# Patient Record
Sex: Male | Born: 1995 | Race: White | Hispanic: No | Marital: Single | Smoking: Current every day smoker
Health system: Southern US, Community
[De-identification: ages and names within clinical notes are randomized; demographics above are authoritative.]

---

## 2020-12-28 ENCOUNTER — Inpatient Hospital Stay (HOSPITAL_COMMUNITY): Payer: Commercial Managed Care - PPO

## 2020-12-28 ENCOUNTER — Emergency Department (HOSPITAL_COMMUNITY): Payer: Commercial Managed Care - PPO

## 2020-12-28 ENCOUNTER — Inpatient Hospital Stay (HOSPITAL_COMMUNITY)
Admission: EM | Admit: 2020-12-28 | Discharge: 2021-01-03 | DRG: 329 | Disposition: A | Discharge status: Home / Self Care | Payer: Commercial Managed Care - PPO | Attending: General Surgery | Admitting: General Surgery

## 2020-12-28 ENCOUNTER — Emergency Department (HOSPITAL_COMMUNITY): Payer: Commercial Managed Care - PPO | Admitting: Registered Nurse

## 2020-12-28 ENCOUNTER — Encounter (HOSPITAL_COMMUNITY): Admission: EM | Disposition: A | Discharge status: Home / Self Care | Payer: Self-pay | Source: Home / Self Care

## 2020-12-28 DIAGNOSIS — J95821 Acute postprocedural respiratory failure: Secondary | ICD-10-CM | POA: Diagnosis not present

## 2020-12-28 DIAGNOSIS — D62 Acute posthemorrhagic anemia: Secondary | ICD-10-CM | POA: Diagnosis present

## 2020-12-28 DIAGNOSIS — S91311A Laceration without foreign body, right foot, initial encounter: Secondary | ICD-10-CM | POA: Diagnosis present

## 2020-12-28 DIAGNOSIS — Y92410 Unspecified street and highway as the place of occurrence of the external cause: Secondary | ICD-10-CM

## 2020-12-28 DIAGNOSIS — E872 Acidosis: Secondary | ICD-10-CM | POA: Diagnosis present

## 2020-12-28 DIAGNOSIS — Z9911 Dependence on respirator [ventilator] status: Secondary | ICD-10-CM

## 2020-12-28 DIAGNOSIS — Z20822 Contact with and (suspected) exposure to covid-19: Secondary | ICD-10-CM | POA: Diagnosis present

## 2020-12-28 DIAGNOSIS — S01511A Laceration without foreign body of lip, initial encounter: Secondary | ICD-10-CM | POA: Diagnosis present

## 2020-12-28 DIAGNOSIS — E876 Hypokalemia: Secondary | ICD-10-CM | POA: Diagnosis present

## 2020-12-28 DIAGNOSIS — R571 Hypovolemic shock: Secondary | ICD-10-CM | POA: Diagnosis present

## 2020-12-28 DIAGNOSIS — Z9049 Acquired absence of other specified parts of digestive tract: Secondary | ICD-10-CM

## 2020-12-28 DIAGNOSIS — R4781 Slurred speech: Secondary | ICD-10-CM | POA: Diagnosis present

## 2020-12-28 DIAGNOSIS — Z880 Allergy status to penicillin: Secondary | ICD-10-CM

## 2020-12-28 DIAGNOSIS — K567 Ileus, unspecified: Secondary | ICD-10-CM

## 2020-12-28 DIAGNOSIS — S92351A Displaced fracture of fifth metatarsal bone, right foot, initial encounter for closed fracture: Secondary | ICD-10-CM | POA: Diagnosis present

## 2020-12-28 DIAGNOSIS — S39091A Other injury of muscle, fascia and tendon of abdomen, initial encounter: Secondary | ICD-10-CM | POA: Diagnosis present

## 2020-12-28 DIAGNOSIS — Z23 Encounter for immunization: Secondary | ICD-10-CM

## 2020-12-28 DIAGNOSIS — T1490XA Injury, unspecified, initial encounter: Secondary | ICD-10-CM

## 2020-12-28 DIAGNOSIS — M549 Dorsalgia, unspecified: Secondary | ICD-10-CM | POA: Diagnosis present

## 2020-12-28 DIAGNOSIS — S36498A Other injury of other part of small intestine, initial encounter: Secondary | ICD-10-CM | POA: Diagnosis present

## 2020-12-28 DIAGNOSIS — K9189 Other postprocedural complications and disorders of digestive system: Secondary | ICD-10-CM

## 2020-12-28 DIAGNOSIS — T148XXA Other injury of unspecified body region, initial encounter: Secondary | ICD-10-CM

## 2020-12-28 DIAGNOSIS — S36899A Unspecified injury of other intra-abdominal organs, initial encounter: Secondary | ICD-10-CM

## 2020-12-28 HISTORY — PX: ABDOMINAL WALL DEFECT REPAIR: SHX53

## 2020-12-28 HISTORY — PX: COMPLEX WOUND CLOSURE: SHX6446

## 2020-12-28 HISTORY — PX: LAPAROTOMY: SHX154

## 2020-12-28 HISTORY — PX: BOWEL RESECTION: SHX1257

## 2020-12-28 HISTORY — PX: APPLICATION OF WOUND VAC: SHX5189

## 2020-12-28 LAB — POCT I-STAT 7, (LYTES, BLD GAS, ICA,H+H)
Acid-base deficit: 14 mmol/L — ABNORMAL HIGH (ref 0.0–2.0)
Acid-base deficit: 10 mmol/L — ABNORMAL HIGH (ref 0.0–2.0)
Acid-base deficit: 11 mmol/L — ABNORMAL HIGH (ref 0.0–2.0)
Acid-base deficit: 13 mmol/L — ABNORMAL HIGH (ref 0.0–2.0)
Acid-base deficit: 5 mmol/L — ABNORMAL HIGH (ref 0.0–2.0)
Bicarbonate: 15.6 mmol/L — ABNORMAL LOW (ref 20.0–28.0)
Bicarbonate: 14.9 mmol/L — ABNORMAL LOW (ref 20.0–28.0)
Bicarbonate: 16.4 mmol/L — ABNORMAL LOW (ref 20.0–28.0)
Bicarbonate: 16.6 mmol/L — ABNORMAL LOW (ref 20.0–28.0)
Bicarbonate: 20.2 mmol/L (ref 20.0–28.0)
Calcium, Ion: 0.99 mmol/L — ABNORMAL LOW (ref 1.15–1.40)
Calcium, Ion: 0.69 mmol/L — CL (ref 1.15–1.40)
Calcium, Ion: 0.93 mmol/L — ABNORMAL LOW (ref 1.15–1.40)
Calcium, Ion: 1.08 mmol/L — ABNORMAL LOW (ref 1.15–1.40)
Calcium, Ion: 1.17 mmol/L (ref 1.15–1.40)
HCT: 30 % — ABNORMAL LOW (ref 39.0–52.0)
HCT: 25 % — ABNORMAL LOW (ref 39.0–52.0)
HCT: 26 % — ABNORMAL LOW (ref 39.0–52.0)
HCT: 27 % — ABNORMAL LOW (ref 39.0–52.0)
HCT: 33 % — ABNORMAL LOW (ref 39.0–52.0)
Hemoglobin: 10.2 g/dL — ABNORMAL LOW (ref 13.0–17.0)
Hemoglobin: 8.5 g/dL — ABNORMAL LOW (ref 13.0–17.0)
Hemoglobin: 8.8 g/dL — ABNORMAL LOW (ref 13.0–17.0)
Hemoglobin: 9.2 g/dL — ABNORMAL LOW (ref 13.0–17.0)
Hemoglobin: 11.2 g/dL — ABNORMAL LOW (ref 13.0–17.0)
O2 Saturation: 100 %
O2 Saturation: 100 %
O2 Saturation: 100 %
O2 Saturation: 100 %
O2 Saturation: 92 %
Patient temperature: 35.9
Patient temperature: 35.4
Patient temperature: 35.5
Patient temperature: 35.6
Patient temperature: 98.5
Potassium: 3.1 mmol/L — ABNORMAL LOW (ref 3.5–5.1)
Potassium: 4 mmol/L (ref 3.5–5.1)
Potassium: 4.1 mmol/L (ref 3.5–5.1)
Potassium: 4.1 mmol/L (ref 3.5–5.1)
Potassium: 3.6 mmol/L (ref 3.5–5.1)
Sodium: 147 mmol/L — ABNORMAL HIGH (ref 135–145)
Sodium: 146 mmol/L — ABNORMAL HIGH (ref 135–145)
Sodium: 146 mmol/L — ABNORMAL HIGH (ref 135–145)
Sodium: 146 mmol/L — ABNORMAL HIGH (ref 135–145)
Sodium: 147 mmol/L — ABNORMAL HIGH (ref 135–145)
TCO2: 17 mmol/L — ABNORMAL LOW (ref 22–32)
TCO2: 16 mmol/L — ABNORMAL LOW (ref 22–32)
TCO2: 18 mmol/L — ABNORMAL LOW (ref 22–32)
TCO2: 18 mmol/L — ABNORMAL LOW (ref 22–32)
TCO2: 21 mmol/L — ABNORMAL LOW (ref 22–32)
pCO2 arterial: 48.1 mmHg — ABNORMAL HIGH (ref 32.0–48.0)
pCO2 arterial: 34.6 mmHg (ref 32.0–48.0)
pCO2 arterial: 39.7 mmHg (ref 32.0–48.0)
pCO2 arterial: 40.4 mmHg (ref 32.0–48.0)
pCO2 arterial: 38.3 mmHg (ref 32.0–48.0)
pH, Arterial: 7.112 — CL (ref 7.350–7.450)
pH, Arterial: 7.167 — CL (ref 7.350–7.450)
pH, Arterial: 7.216 — ABNORMAL LOW (ref 7.350–7.450)
pH, Arterial: 7.282 — ABNORMAL LOW (ref 7.350–7.450)
pH, Arterial: 7.329 — ABNORMAL LOW (ref 7.350–7.450)
pO2, Arterial: 586 mmHg — ABNORMAL HIGH (ref 83.0–108.0)
pO2, Arterial: 242 mmHg — ABNORMAL HIGH (ref 83.0–108.0)
pO2, Arterial: 287 mmHg — ABNORMAL HIGH (ref 83.0–108.0)
pO2, Arterial: 390 mmHg — ABNORMAL HIGH (ref 83.0–108.0)
pO2, Arterial: 67 mmHg — ABNORMAL LOW (ref 83.0–108.0)

## 2020-12-28 LAB — I-STAT CHEM 8, ED
BUN: 12 mg/dL (ref 6–20)
Calcium, Ion: 0.97 mmol/L — ABNORMAL LOW (ref 1.15–1.40)
Chloride: 111 mmol/L (ref 98–111)
Creatinine, Ser: 1.6 mg/dL — ABNORMAL HIGH (ref 0.61–1.24)
Glucose, Bld: 142 mg/dL — ABNORMAL HIGH (ref 70–99)
HCT: 46 % (ref 39.0–52.0)
Hemoglobin: 15.6 g/dL (ref 13.0–17.0)
Potassium: 3.6 mmol/L (ref 3.5–5.1)
Sodium: 144 mmol/L (ref 135–145)
TCO2: 17 mmol/L — ABNORMAL LOW (ref 22–32)

## 2020-12-28 LAB — BASIC METABOLIC PANEL
Anion gap: 13 (ref 5–15)
BUN: 8 mg/dL (ref 6–20)
CO2: 21 mmol/L — ABNORMAL LOW (ref 22–32)
Calcium: 8.6 mg/dL — ABNORMAL LOW (ref 8.9–10.3)
Chloride: 110 mmol/L (ref 98–111)
Creatinine, Ser: 0.89 mg/dL (ref 0.61–1.24)
GFR, Estimated: >60 mL/min (ref >60)
Glucose, Bld: 109 mg/dL — ABNORMAL HIGH (ref 70–99)
Potassium: 3.5 mmol/L (ref 3.5–5.1)
Sodium: 144 mmol/L (ref 135–145)

## 2020-12-28 LAB — HIV ANTIBODY (ROUTINE TESTING W REFLEX): HIV Screen 4th Generation wRfx: NONREACTIVE

## 2020-12-28 LAB — RESP PANEL BY RT-PCR (FLU A&B, COVID) ARPGX2
Influenza A by PCR: NEGATIVE
Influenza B by PCR: NEGATIVE
SARS Coronavirus 2 by RT PCR: NEGATIVE

## 2020-12-28 LAB — GLUCOSE, CAPILLARY
Glucose-Capillary: 106 mg/dL — ABNORMAL HIGH (ref 70–99)
Glucose-Capillary: 94 mg/dL (ref 70–99)

## 2020-12-28 LAB — ABO/RH: ABO/RH(D): O POS

## 2020-12-28 LAB — CBC
HCT: 33.2 % — ABNORMAL LOW (ref 39.0–52.0)
Hemoglobin: 11.7 g/dL — ABNORMAL LOW (ref 13.0–17.0)
MCH: 30.1 pg (ref 26.0–34.0)
MCHC: 35.2 g/dL (ref 30.0–36.0)
MCV: 85.3 fL (ref 80.0–100.0)
Platelets: 105 10*3/uL — ABNORMAL LOW (ref 150–400)
RBC: 3.89 MIL/uL — ABNORMAL LOW (ref 4.22–5.81)
RDW: 13.9 % (ref 11.5–15.5)
WBC: 8.8 10*3/uL (ref 4.0–10.5)
nRBC: 0 % (ref 0.0–0.2)

## 2020-12-28 LAB — BLOOD PRODUCT ORDER (VERBAL) VERIFICATION

## 2020-12-28 LAB — ETHANOL: Alcohol, Ethyl (B): 50 mg/dL — ABNORMAL HIGH (ref <10)

## 2020-12-28 LAB — MRSA NEXT GEN BY PCR, NASAL: MRSA by PCR Next Gen: NOT DETECTED

## 2020-12-28 SURGERY — LAPAROTOMY, EXPLORATORY
Anesthesia: General | Site: Ankle | Laterality: Right

## 2020-12-28 MED ORDER — CALCIUM CHLORIDE 10 % IV SOLN
INTRAVENOUS | Status: DC | PRN
  Administered 2020-12-28 (×3): 500 mg via INTRAVENOUS

## 2020-12-28 MED ORDER — NOREPINEPHRINE 4 MG/250ML-% IV SOLN
0.0000 ug/min | INTRAVENOUS | Status: DC
  Administered 2020-12-29: 5 ug/min via INTRAVENOUS
  Filled 2020-12-28: qty 250

## 2020-12-28 MED ORDER — PANTOPRAZOLE SODIUM 40 MG PO PACK: 40.0000 mg | PACK | Freq: Every day | ORAL | Status: DC

## 2020-12-28 MED ORDER — CALCIUM CHLORIDE 10 % IV SOLN
INTRAVENOUS | Status: AC
  Filled 2020-12-28: qty 20

## 2020-12-28 MED ORDER — PANTOPRAZOLE SODIUM 40 MG PO TBEC: 40.0000 mg | DELAYED_RELEASE_TABLET | Freq: Every day | ORAL | Status: DC

## 2020-12-28 MED ORDER — PANTOPRAZOLE SODIUM 40 MG IV SOLR
40.0000 mg | Freq: Every day | INTRAVENOUS | Status: DC
  Administered 2020-12-28 – 2020-12-31 (×4): 40 mg via INTRAVENOUS
  Filled 2020-12-28 (×4): qty 40

## 2020-12-28 MED ORDER — ORAL CARE MOUTH RINSE
15.0000 mL | OROMUCOSAL | Status: DC
  Administered 2020-12-28 – 2020-12-29 (×10): 15 mL via OROMUCOSAL

## 2020-12-28 MED ORDER — PANTOPRAZOLE SODIUM 40 MG IV SOLR: 40.0000 mg | Freq: Every day | INTRAVENOUS | Status: DC

## 2020-12-28 MED ORDER — FENTANYL CITRATE (PF) 250 MCG/5ML IJ SOLN
INTRAMUSCULAR | Status: DC | PRN
  Administered 2020-12-28 (×4): 100 ug via INTRAVENOUS
  Administered 2020-12-28 (×2): 50 ug via INTRAVENOUS

## 2020-12-28 MED ORDER — METOPROLOL TARTRATE 5 MG/5ML IV SOLN: 5.0000 mg | Freq: Four times a day (QID) | INTRAVENOUS | Status: DC | PRN

## 2020-12-28 MED ORDER — CHLORHEXIDINE GLUCONATE CLOTH 2 % EX PADS
6.0000 | MEDICATED_PAD | Freq: Every day | CUTANEOUS | Status: DC
  Administered 2020-12-28 – 2021-01-03 (×7): 6 via TOPICAL

## 2020-12-28 MED ORDER — IOHEXOL 300 MG/ML  SOLN
100.0000 mL | Freq: Once | INTRAMUSCULAR | Status: AC | PRN
  Administered 2020-12-28: 100 mL via INTRAVENOUS

## 2020-12-28 MED ORDER — ALBUMIN HUMAN 5 % IV SOLN: INTRAVENOUS | Status: DC | PRN

## 2020-12-28 MED ORDER — MIDAZOLAM HCL 5 MG/5ML IJ SOLN
INTRAMUSCULAR | Status: DC | PRN
  Administered 2020-12-28: 2 mg via INTRAVENOUS

## 2020-12-28 MED ORDER — CHLORHEXIDINE GLUCONATE 0.12% ORAL RINSE (MEDLINE KIT)
15.0000 mL | Freq: Two times a day (BID) | OROMUCOSAL | Status: DC
  Administered 2020-12-28 – 2020-12-29 (×4): 15 mL via OROMUCOSAL

## 2020-12-28 MED ORDER — ROCURONIUM BROMIDE 10 MG/ML (PF) SYRINGE
PREFILLED_SYRINGE | INTRAVENOUS | Status: DC | PRN
  Administered 2020-12-28: 100 mg via INTRAVENOUS

## 2020-12-28 MED ORDER — FENTANYL CITRATE (PF) 100 MCG/2ML IJ SOLN
50.0000 ug | Freq: Once | INTRAMUSCULAR | Status: DC
Start: 2020-12-28 — End: 2020-12-29

## 2020-12-28 MED ORDER — LACTATED RINGERS IV SOLN: INTRAVENOUS | Status: DC | PRN

## 2020-12-28 MED ORDER — CEFAZOLIN SODIUM-DEXTROSE 2-3 GM-%(50ML) IV SOLR
INTRAVENOUS | Status: DC | PRN
  Administered 2020-12-28: 2 g via INTRAVENOUS

## 2020-12-28 MED ORDER — SODIUM CHLORIDE 0.9 % IV SOLN: INTRAVENOUS | Status: DC | PRN

## 2020-12-28 MED ORDER — POLYETHYLENE GLYCOL 3350 17 G PO PACK
17.0000 g | PACK | Freq: Every day | ORAL | Status: DC
  Administered 2020-12-28: 17 g
  Filled 2020-12-28: qty 1

## 2020-12-28 MED ORDER — PROPOFOL 500 MG/50ML IV EMUL
INTRAVENOUS | Status: DC | PRN
  Administered 2020-12-28: 50 ug/kg/min via INTRAVENOUS

## 2020-12-28 MED ORDER — METOPROLOL TARTRATE 5 MG/5ML IV SOLN
INTRAVENOUS | Status: AC
  Administered 2020-12-28: 5 mg via INTRAVENOUS
  Filled 2020-12-28: qty 5

## 2020-12-28 MED ORDER — MIDAZOLAM HCL 2 MG/2ML IJ SOLN
2.0000 mg | INTRAMUSCULAR | Status: DC | PRN
  Administered 2020-12-28 – 2020-12-29 (×7): 2 mg via INTRAVENOUS
  Filled 2020-12-28 (×2): qty 4
  Filled 2020-12-28 (×2): qty 2
  Filled 2020-12-28: qty 4

## 2020-12-28 MED ORDER — PHENYLEPHRINE HCL-NACL 10-0.9 MG/250ML-% IV SOLN
INTRAVENOUS | Status: DC | PRN
Start: 2020-12-28 — End: 2020-12-28
  Administered 2020-12-28: 25 ug/min via INTRAVENOUS

## 2020-12-28 MED ORDER — ACETAMINOPHEN 10 MG/ML IV SOLN
1000.0000 mg | Freq: Four times a day (QID) | INTRAVENOUS | Status: AC
  Administered 2020-12-28 – 2020-12-29 (×4): 1000 mg via INTRAVENOUS
  Filled 2020-12-28 (×4): qty 100

## 2020-12-28 MED ORDER — FENTANYL BOLUS VIA INFUSION
50.0000 ug | INTRAVENOUS | Status: DC | PRN
  Administered 2020-12-28 (×2): 100 ug via INTRAVENOUS
  Filled 2020-12-28: qty 100

## 2020-12-28 MED ORDER — FENTANYL CITRATE (PF) 250 MCG/5ML IJ SOLN
INTRAMUSCULAR | Status: AC
  Filled 2020-12-28: qty 5

## 2020-12-28 MED ORDER — NOREPINEPHRINE 4 MG/250ML-% IV SOLN
INTRAVENOUS | Status: AC
  Administered 2020-12-28: 2 ug/min via INTRAVENOUS
  Filled 2020-12-28: qty 250

## 2020-12-28 MED ORDER — KETOROLAC TROMETHAMINE 15 MG/ML IJ SOLN
30.0000 mg | Freq: Four times a day (QID) | INTRAMUSCULAR | Status: DC
  Administered 2020-12-28 – 2020-12-30 (×10): 30 mg via INTRAVENOUS
  Filled 2020-12-28 (×10): qty 2

## 2020-12-28 MED ORDER — DOCUSATE SODIUM 50 MG/5ML PO LIQD
100.0000 mg | Freq: Two times a day (BID) | ORAL | Status: DC
  Administered 2020-12-28 (×2): 100 mg
  Filled 2020-12-28 (×2): qty 10

## 2020-12-28 MED ORDER — PROPOFOL 1000 MG/100ML IV EMUL
0.0000 ug/kg/min | INTRAVENOUS | Status: DC
  Administered 2020-12-28 – 2020-12-29 (×7): 50 ug/kg/min via INTRAVENOUS
  Filled 2020-12-28 (×6): qty 100

## 2020-12-28 MED ORDER — STERILE WATER FOR IRRIGATION IR SOLN
Status: DC | PRN
  Administered 2020-12-28: 1000 mL

## 2020-12-28 MED ORDER — FENTANYL 2500MCG IN NS 250ML (10MCG/ML) PREMIX INFUSION
0.0000 ug/h | INTRAVENOUS | Status: DC
  Administered 2020-12-28: 400 ug/h via INTRAVENOUS
  Administered 2020-12-28: 50 ug/h via INTRAVENOUS
  Administered 2020-12-28 – 2020-12-29 (×2): 400 ug/h via INTRAVENOUS
  Filled 2020-12-28 (×5): qty 250

## 2020-12-28 MED ORDER — METHOCARBAMOL 1000 MG/10ML IJ SOLN
1000.0000 mg | Freq: Three times a day (TID) | INTRAVENOUS | Status: DC
  Administered 2020-12-28 – 2020-12-30 (×5): 1000 mg via INTRAVENOUS
  Filled 2020-12-28: qty 10
  Filled 2020-12-28: qty 1000
  Filled 2020-12-28 (×7): qty 10
  Filled 2020-12-28: qty 1000
  Filled 2020-12-28 (×2): qty 10

## 2020-12-28 MED ORDER — ONDANSETRON HCL 4 MG/2ML IJ SOLN
4.0000 mg | Freq: Four times a day (QID) | INTRAMUSCULAR | Status: DC | PRN
  Administered 2021-01-02 (×2): 4 mg via INTRAVENOUS
  Filled 2020-12-28 (×2): qty 2

## 2020-12-28 MED ORDER — 0.9 % SODIUM CHLORIDE (POUR BTL) OPTIME
TOPICAL | Status: DC | PRN
  Administered 2020-12-28 (×5): 1000 mL

## 2020-12-28 MED ORDER — PHENYLEPHRINE 40 MCG/ML (10ML) SYRINGE FOR IV PUSH (FOR BLOOD PRESSURE SUPPORT)
PREFILLED_SYRINGE | INTRAVENOUS | Status: DC | PRN
Start: 2020-12-28 — End: 2020-12-28
  Administered 2020-12-28 (×7): 80 ug via INTRAVENOUS

## 2020-12-28 MED ORDER — LACTATED RINGERS IV SOLN: INTRAVENOUS | Status: DC

## 2020-12-28 MED ORDER — MIDAZOLAM HCL 2 MG/2ML IJ SOLN
INTRAMUSCULAR | Status: AC
  Filled 2020-12-28: qty 2

## 2020-12-28 MED ORDER — OXYCODONE HCL 5 MG/5ML PO SOLN
5.0000 mg | ORAL | Status: DC | PRN
  Administered 2020-12-28 (×3): 10 mg
  Filled 2020-12-28 (×3): qty 10

## 2020-12-28 MED ORDER — ONDANSETRON 4 MG PO TBDP: 4.0000 mg | ORAL_TABLET | Freq: Four times a day (QID) | ORAL | Status: DC | PRN

## 2020-12-28 SURGICAL SUPPLY — 54 items
BAG COUNTER SPONGE SURGICOUNT (BAG) ×4 IMPLANT
BENZOIN TINCTURE PRP APPL 2/3 (GAUZE/BANDAGES/DRESSINGS) ×4 IMPLANT
BLADE CLIPPER SURG (BLADE) IMPLANT
BNDG ADH 1X3 SHEER STRL LF (GAUZE/BANDAGES/DRESSINGS) ×4 IMPLANT
BNDG GAUZE ELAST 4 BULKY (GAUZE/BANDAGES/DRESSINGS) ×4 IMPLANT
CANISTER SUCT 3000ML PPV (MISCELLANEOUS) ×4 IMPLANT
CANISTER WOUNDNEG PRESSURE 500 (CANNISTER) ×4 IMPLANT
CHLORAPREP W/TINT 26 (MISCELLANEOUS) ×4 IMPLANT
COVER SURGICAL LIGHT HANDLE (MISCELLANEOUS) ×4 IMPLANT
DRAPE LAPAROSCOPIC ABDOMINAL (DRAPES) ×4 IMPLANT
DRAPE WARM FLUID 44X44 (DRAPES) IMPLANT
DRSG OPSITE POSTOP 4X10 (GAUZE/BANDAGES/DRESSINGS) IMPLANT
DRSG OPSITE POSTOP 4X8 (GAUZE/BANDAGES/DRESSINGS) IMPLANT
ELECT BLADE 4.0 EZ CLEAN MEGAD (MISCELLANEOUS) ×4
ELECT BLADE 6.5 EXT (BLADE) IMPLANT
ELECT CAUTERY BLADE 6.4 (BLADE) ×4 IMPLANT
ELECT REM PT RETURN 9FT ADLT (ELECTROSURGICAL) ×4
ELECTRODE BLDE 4.0 EZ CLN MEGD (MISCELLANEOUS) ×3 IMPLANT
ELECTRODE REM PT RTRN 9FT ADLT (ELECTROSURGICAL) ×3 IMPLANT
GAUZE SPONGE 4X4 12PLY STRL (GAUZE/BANDAGES/DRESSINGS) ×4 IMPLANT
GAUZE XEROFORM 1X8 LF (GAUZE/BANDAGES/DRESSINGS) ×4 IMPLANT
GLOVE SRG 8 PF TXTR STRL LF DI (GLOVE) ×3 IMPLANT
GLOVE SURG ENC MOIS LTX SZ8 (GLOVE) ×12 IMPLANT
GLOVE SURG UNDER POLY LF SZ8 (GLOVE) ×1
GOWN STRL REUS W/ TWL LRG LVL3 (GOWN DISPOSABLE) IMPLANT
GOWN STRL REUS W/ TWL XL LVL3 (GOWN DISPOSABLE) ×6 IMPLANT
GOWN STRL REUS W/TWL LRG LVL3 (GOWN DISPOSABLE)
GOWN STRL REUS W/TWL XL LVL3 (GOWN DISPOSABLE) ×2
HANDLE SUCTION POOLE (INSTRUMENTS) ×3 IMPLANT
KIT BASIN OR (CUSTOM PROCEDURE TRAY) ×4 IMPLANT
KIT TURNOVER KIT B (KITS) ×4 IMPLANT
LIGASURE IMPACT 36 18CM CVD LR (INSTRUMENTS) ×4 IMPLANT
NS IRRIG 1000ML POUR BTL (IV SOLUTION) ×8 IMPLANT
PACK GENERAL/GYN (CUSTOM PROCEDURE TRAY) ×4 IMPLANT
PAD ARMBOARD 7.5X6 YLW CONV (MISCELLANEOUS) ×4 IMPLANT
PENCIL SMOKE EVACUATOR (MISCELLANEOUS) ×4 IMPLANT
RELOAD PROXIMATE 75MM BLUE (ENDOMECHANICALS) ×12 IMPLANT
RELOAD PROXIMATE TA60MM BLUE (ENDOMECHANICALS) ×4 IMPLANT
SPECIMEN JAR LARGE (MISCELLANEOUS) ×4 IMPLANT
SPONGE ABD ABTHERA ADVANCE (MISCELLANEOUS) ×4 IMPLANT
SPONGE T-LAP 18X18 ~~LOC~~+RFID (SPONGE) ×44 IMPLANT
STAPLER GUN LINEAR PROX 60 (STAPLE) ×4 IMPLANT
STAPLER PROXIMATE 75MM BLUE (STAPLE) ×4 IMPLANT
STAPLER VISISTAT 35W (STAPLE) ×4 IMPLANT
SUCTION POOLE HANDLE (INSTRUMENTS) ×4
SUT PDS AB 1 TP1 96 (SUTURE) ×8 IMPLANT
SUT PROLENE 2 0 SH 30 (SUTURE) ×8 IMPLANT
SUT SILK 2 0 SH CR/8 (SUTURE) ×20 IMPLANT
SUT SILK 2 0 TIES 10X30 (SUTURE) ×4 IMPLANT
SUT SILK 3 0 SH CR/8 (SUTURE) ×4 IMPLANT
SUT SILK 3 0 TIES 10X30 (SUTURE) ×4 IMPLANT
TOWEL GREEN STERILE (TOWEL DISPOSABLE) ×4 IMPLANT
TRAY FOLEY MTR SLVR 16FR STAT (SET/KITS/TRAYS/PACK) ×8 IMPLANT
YANKAUER SUCT BULB TIP NO VENT (SUCTIONS) ×4 IMPLANT

## 2020-12-28 NOTE — TOC CAGE-AID Note (Signed)
Transition of Care [facility]) - CAGE-AID Screening   Patient Details  Name: Micheal Mueller MRN: [id number] Date of Birth: [birth year]  Transition of Care [facility]) CM/SW Contact:    Mckensey Berghuis C Tarpley-Carter, LCSWA Phone Number: 12/28/2020, 8:31 AM   Clinical Narrative: Pt is unable to participate in Cage Aid. Pt is intubated.  Merrianne Mccumbers Tarpley-Carter, MSW, LCSW-A Pronouns:  She/Her/Hers [facility] Clinical Social Worker Direct Number:  [phone] [email or web address]   CAGE-AID Screening: Substance Abuse Screening unable to be completed due to: : Patient unable to participate ((Pt is intubated.))
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
## 2020-12-28 NOTE — Anesthesia Procedure Notes (Signed)
Arterial Line Insertion Start/End7/22/2022 5:17 AM, 12/28/2020 5:22 AM Performed by: Heather Roberts, MD, anesthesiologist  Patient location: Pre-op. Preanesthetic checklist: patient identified, IV checked, site marked, risks and benefits discussed, surgical consent, monitors and equipment checked, pre-op evaluation, timeout performed and anesthesia consent Lidocaine 1% used for infiltration Right, radial was placed Catheter size: 20 Fr Hand hygiene performed  and maximum sterile barriers used   Attempts: 1 Procedure performed using ultrasound guided technique. Following insertion, dressing applied and Biopatch. Post procedure assessment: normal and unchanged  Patient tolerated the procedure well with no immediate complications. Additional procedure comments: Unable to save images.
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
## 2020-12-28 NOTE — Anesthesia Postprocedure Evaluation (Signed)
Anesthesia Post Note  Patient: Micheal Mueller  Procedure(s) Performed: EXPLORATORY LAPAROTOMY (Abdomen) SMALL BOWEL RESECTION (Abdomen) ABTHERA  ADVANCE ABDOMINAL  WOUND VAC (Abdomen) Closure of right heel laceration (Right: Ankle) REPAIR ABDOMINAL WALL (Abdomen)     Patient location during evaluation: SICU Anesthesia Type: General Level of consciousness: sedated Pain management: pain level controlled Vital Signs Assessment: post-procedure vital signs reviewed and stable Respiratory status: patient remains intubated per anesthesia plan Cardiovascular status: stable Postop Assessment: no apparent nausea or vomiting Anesthetic complications: no   No notable events documented.  Last Vitals:  Vitals:   12/28/20 0452 12/28/20 0720  BP:    Pulse: (!) 152   Resp: 18   Temp:    SpO2: 100% 95%    Last Pain:  Vitals:   12/28/20 0430  TempSrc: Temporal                 Octa Uplinger DANIEL

## 2020-12-28 NOTE — Transfer of Care (Signed)
Immediate Anesthesia Transfer of Care Note  Patient: Micheal Mueller  Procedure(s) Performed: EXPLORATORY LAPAROTOMY (Abdomen) SMALL BOWEL RESECTION (Abdomen) ABTHERA  ADVANCE ABDOMINAL  WOUND VAC (Abdomen) Closure of right heel laceration (Right: Ankle) REPAIR ABDOMINAL WALL (Abdomen)  Patient Location: NICU  Anesthesia Type:General  Level of Consciousness: Patient remains intubated per anesthesia plan  Airway & Oxygen Therapy: Patient remains intubated per anesthesia plan  Post-op Assessment: Report given to RN and Post -op Vital signs reviewed and stable  Post vital signs: Reviewed and stable  Last Vitals:  Vitals Value Taken Time  BP 127/79 12/28/20 0730  Temp    Pulse 106 12/28/20 0732  Resp 16 12/28/20 0732  SpO2 95 % 12/28/20 0732  Vitals shown include unvalidated device data.  Last Pain:  Vitals:   12/28/20 0430  TempSrc: Temporal         Complications: No notable events documented.
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
## 2020-12-28 NOTE — ED Notes (Signed)
Belonging locked up on security, wallet no money , cell phone, keys and earpods.

## 2020-12-28 NOTE — Progress Notes (Signed)
TRN went up to unit and primary RN was talking to this RN. Right ankle dressing noted to be saturated. This RN and primary RN changed the kerlex dressing to the right ankle.
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
## 2020-12-28 NOTE — Progress Notes (Signed)
Orthopedic Tech Progress Note Patient Details:  Micheal Mueller 08-19-1995 [id number] Dropped off at patient room Ortho Devices Type of Ortho Device: Postop shoe/boot Ortho Device/Splint Interventions: Ordered   Post Interventions Patient Tolerated: Other (comment) Instructions Provided: Other (comment)  Michelle Piper 12/28/2020, 11:17 PM
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
## 2020-12-28 NOTE — Anesthesia Procedure Notes (Signed)
Central Venous Catheter Insertion Performed by: Heather Roberts, MD, anesthesiologist Start/End7/22/2022 5:26 AM, 12/28/2020 5:36 AM Patient location: Pre-op. Preanesthetic checklist: patient identified, IV checked, site marked, risks and benefits discussed, surgical consent, monitors and equipment checked, pre-op evaluation, timeout performed and anesthesia consent Position: Trendelenburg Lidocaine 1% used for infiltration and patient sedated Hand hygiene performed , maximum sterile barriers used  and Seldinger technique used Catheter size: 8.5 Fr Total catheter length 8. Central line was placed.Sheath introducer Procedure performed using ultrasound guided technique. Ultrasound Notes:anatomy identified, needle tip was noted to be adjacent to the nerve/plexus identified, no ultrasound evidence of intravascular and/or intraneural injection and image(s) printed for medical record Attempts: 1 Following insertion, line sutured, dressing applied and Biopatch. Post procedure assessment: blood return through all ports, free fluid flow and no air  Patient tolerated the procedure well with no immediate complications.
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
## 2020-12-28 NOTE — Anesthesia Preprocedure Evaluation (Signed)
Anesthesia Evaluation    Reviewed: Allergy & Precautions, Patient's Chart, lab work & pertinent test results, Unable to perform ROS - Chart review onlyPreop documentation limited or incomplete due to emergent nature of procedure.  Airway Mallampati: Intubated       Dental   Pulmonary neg pulmonary ROS,           Cardiovascular negative cardio ROS       Neuro/Psych negative neurological ROS     GI/Hepatic negative GI ROS, Neg liver ROS,   Endo/Other  negative endocrine ROS  Renal/GU negative Renal ROS     Musculoskeletal negative musculoskeletal ROS (+)   Abdominal   Peds  Hematology negative hematology ROS (+)   Anesthesia Other Findings   Reproductive/Obstetrics                             Anesthesia Physical Anesthesia Plan  ASA: 4 and emergent  Anesthesia Plan: General   Post-op Pain Management:    Induction: Intravenous  PONV Risk Score and Plan:   Airway Management Planned:   Additional Equipment:   Intra-op Plan:   Post-operative Plan: Post-operative intubation/ventilation  Informed Consent:   Plan Discussed with: Anesthesiologist and CRNA  Anesthesia Plan Comments:         Anesthesia Quick Evaluation

## 2020-12-29 ENCOUNTER — Inpatient Hospital Stay (HOSPITAL_COMMUNITY): Payer: Commercial Managed Care - PPO | Admitting: Anesthesiology

## 2020-12-29 ENCOUNTER — Encounter (HOSPITAL_COMMUNITY): Admission: EM | Disposition: A | Discharge status: Home / Self Care | Payer: Self-pay | Source: Home / Self Care

## 2020-12-29 HISTORY — PX: LAPAROTOMY: SHX154

## 2020-12-29 LAB — BPAM FFP
Blood Product Expiration Date: 202207272359
Blood Product Expiration Date: 202207272359
Blood Product Expiration Date: 202207272359
Blood Product Expiration Date: 202207272359
Blood Product Expiration Date: 202207272359
Blood Product Expiration Date: 202207272359
Blood Product Expiration Date: 202207272359
Blood Product Expiration Date: 202207272359
ISSUE DATE / TIME: 202207220455
ISSUE DATE / TIME: 202207220455
ISSUE DATE / TIME: 202207220455
ISSUE DATE / TIME: 202207220455
ISSUE DATE / TIME: 202207220548
ISSUE DATE / TIME: 202207220548
ISSUE DATE / TIME: 202207220548
ISSUE DATE / TIME: 202207220548
Unit Type and Rh: 600
Unit Type and Rh: 6200
Unit Type and Rh: 6200
Unit Type and Rh: 6200
Unit Type and Rh: 6200
Unit Type and Rh: 6200
Unit Type and Rh: 6200
Unit Type and Rh: 6200

## 2020-12-29 LAB — BPAM CRYOPRECIPITATE
Blood Product Expiration Date: 202207220819
Blood Product Expiration Date: 202207220819
ISSUE DATE / TIME: 202207220545
ISSUE DATE / TIME: 202207220545
Unit Type and Rh: 6200
Unit Type and Rh: 6200

## 2020-12-29 LAB — PREPARE CRYOPRECIPITATE
Unit division: 0
Unit division: 0

## 2020-12-29 LAB — PREPARE FRESH FROZEN PLASMA
Unit division: 0
Unit division: 0
Unit division: 0
Unit division: 0
Unit division: 0
Unit division: 0
Unit division: 0

## 2020-12-29 LAB — POCT I-STAT 7, (LYTES, BLD GAS, ICA,H+H)
Acid-Base Excess: 3 mmol/L — ABNORMAL HIGH (ref 0.0–2.0)
Bicarbonate: 26.4 mmol/L (ref 20.0–28.0)
Calcium, Ion: 1.15 mmol/L (ref 1.15–1.40)
HCT: 31 % — ABNORMAL LOW (ref 39.0–52.0)
Hemoglobin: 10.5 g/dL — ABNORMAL LOW (ref 13.0–17.0)
O2 Saturation: 100 %
Patient temperature: 99.1
Potassium: 3 mmol/L — ABNORMAL LOW (ref 3.5–5.1)
Sodium: 143 mmol/L (ref 135–145)
TCO2: 28 mmol/L (ref 22–32)
pCO2 arterial: 36 mmHg (ref 32.0–48.0)
pH, Arterial: 7.474 — ABNORMAL HIGH (ref 7.350–7.450)
pO2, Arterial: 187 mmHg — ABNORMAL HIGH (ref 83.0–108.0)

## 2020-12-29 LAB — BASIC METABOLIC PANEL
Anion gap: 8 (ref 5–15)
BUN: 12 mg/dL (ref 6–20)
CO2: 25 mmol/L (ref 22–32)
Calcium: 8 mg/dL — ABNORMAL LOW (ref 8.9–10.3)
Chloride: 106 mmol/L (ref 98–111)
Creatinine, Ser: 1.18 mg/dL (ref 0.61–1.24)
GFR, Estimated: >60 mL/min (ref >60)
Glucose, Bld: 114 mg/dL — ABNORMAL HIGH (ref 70–99)
Potassium: 3.1 mmol/L — ABNORMAL LOW (ref 3.5–5.1)
Sodium: 139 mmol/L (ref 135–145)

## 2020-12-29 LAB — TRIGLYCERIDES: Triglycerides: 95 mg/dL (ref <150)

## 2020-12-29 LAB — BPAM PLATELET PHERESIS
Blood Product Expiration Date: 202207242359
ISSUE DATE / TIME: 202207220544
Unit Type and Rh: 5100

## 2020-12-29 LAB — CBC
HCT: 31.4 % — ABNORMAL LOW (ref 39.0–52.0)
Hemoglobin: 11.4 g/dL — ABNORMAL LOW (ref 13.0–17.0)
MCH: 30.3 pg (ref 26.0–34.0)
MCHC: 36.3 g/dL — ABNORMAL HIGH (ref 30.0–36.0)
MCV: 83.5 fL (ref 80.0–100.0)
Platelets: 122 10*3/uL — ABNORMAL LOW (ref 150–400)
RBC: 3.76 MIL/uL — ABNORMAL LOW (ref 4.22–5.81)
RDW: 14.6 % (ref 11.5–15.5)
WBC: 8.3 10*3/uL (ref 4.0–10.5)
nRBC: 0 % (ref 0.0–0.2)

## 2020-12-29 LAB — PREPARE PLATELET PHERESIS: Unit division: 0

## 2020-12-29 SURGERY — LAPAROTOMY, EXPLORATORY
Anesthesia: General | Site: Abdomen

## 2020-12-29 MED ORDER — HYDROMORPHONE HCL 1 MG/ML IJ SOLN
1.0000 mg | INTRAMUSCULAR | Status: DC | PRN
Start: 2020-12-29 — End: 2020-12-31

## 2020-12-29 MED ORDER — OXYCODONE HCL 5 MG PO TABS: 5.0000 mg | ORAL_TABLET | ORAL | Status: DC | PRN

## 2020-12-29 MED ORDER — OXYCODONE HCL 5 MG PO TABS
10.0000 mg | ORAL_TABLET | ORAL | Status: DC | PRN
Start: 2020-12-29 — End: 2020-12-31

## 2020-12-29 MED ORDER — 0.9 % SODIUM CHLORIDE (POUR BTL) OPTIME
TOPICAL | Status: DC | PRN
  Administered 2020-12-29: 4000 mL

## 2020-12-29 MED ORDER — MIDAZOLAM HCL 2 MG/2ML IJ SOLN
INTRAMUSCULAR | Status: AC
  Filled 2020-12-29: qty 2

## 2020-12-29 MED ORDER — ROCURONIUM BROMIDE 10 MG/ML (PF) SYRINGE
PREFILLED_SYRINGE | INTRAVENOUS | Status: DC | PRN
  Administered 2020-12-29: 60 mg via INTRAVENOUS

## 2020-12-29 MED ORDER — SUGAMMADEX SODIUM 200 MG/2ML IV SOLN
INTRAVENOUS | Status: DC | PRN
  Administered 2020-12-29: 200 mg via INTRAVENOUS

## 2020-12-29 MED ORDER — POTASSIUM CHLORIDE 10 MEQ/100ML IV SOLN
10.0000 meq | INTRAVENOUS | Status: AC
  Administered 2020-12-29 (×4): 10 meq via INTRAVENOUS
  Filled 2020-12-29 (×4): qty 100

## 2020-12-29 MED ORDER — CEFAZOLIN SODIUM-DEXTROSE 2-3 GM-%(50ML) IV SOLR
INTRAVENOUS | Status: DC | PRN
  Administered 2020-12-29: 2 g via INTRAVENOUS

## 2020-12-29 MED ORDER — MIDAZOLAM HCL 5 MG/5ML IJ SOLN
INTRAMUSCULAR | Status: DC | PRN
  Administered 2020-12-29: 2 mg via INTRAVENOUS

## 2020-12-29 MED ORDER — PROPOFOL 1000 MG/100ML IV EMUL
INTRAVENOUS | Status: AC
  Filled 2020-12-29: qty 100

## 2020-12-29 MED ORDER — ACETAMINOPHEN 325 MG PO TABS
650.0000 mg | ORAL_TABLET | Freq: Four times a day (QID) | ORAL | Status: DC
  Administered 2020-12-29 – 2020-12-30 (×7): 650 mg via ORAL
  Filled 2020-12-29 (×9): qty 2

## 2020-12-29 MED ORDER — FENTANYL CITRATE (PF) 250 MCG/5ML IJ SOLN
INTRAMUSCULAR | Status: AC
  Filled 2020-12-29: qty 5

## 2020-12-29 MED ORDER — NICOTINE 21 MG/24HR TD PT24
21.0000 mg | MEDICATED_PATCH | Freq: Every day | TRANSDERMAL | Status: DC
  Administered 2020-12-29 – 2021-01-01 (×4): 21 mg via TRANSDERMAL
  Filled 2020-12-29 (×5): qty 1

## 2020-12-29 MED ORDER — METHOCARBAMOL 1000 MG/10ML IJ SOLN
500.0000 mg | Freq: Four times a day (QID) | INTRAVENOUS | Status: DC | PRN
  Filled 2020-12-29: qty 5

## 2020-12-29 MED ORDER — GABAPENTIN 600 MG PO TABS
300.0000 mg | ORAL_TABLET | Freq: Three times a day (TID) | ORAL | Status: DC
  Administered 2020-12-29 – 2020-12-30 (×4): 300 mg via ORAL
  Filled 2020-12-29 (×6): qty 0.5

## 2020-12-29 SURGICAL SUPPLY — 43 items
BLADE CLIPPER SURG (BLADE) IMPLANT
CANISTER SUCT 3000ML PPV (MISCELLANEOUS) IMPLANT
CANISTER WOUND CARE 500ML ATS (WOUND CARE) ×2 IMPLANT
CHLORAPREP W/TINT 26 (MISCELLANEOUS) ×2 IMPLANT
COVER SURGICAL LIGHT HANDLE (MISCELLANEOUS) ×2 IMPLANT
DRAPE LAPAROSCOPIC ABDOMINAL (DRAPES) ×2 IMPLANT
DRAPE WARM FLUID 44X44 (DRAPES) ×2 IMPLANT
DRESSING PEEL AND PLAC PRVNA20 (GAUZE/BANDAGES/DRESSINGS) ×2 IMPLANT
DRSG OPSITE POSTOP 4X10 (GAUZE/BANDAGES/DRESSINGS) IMPLANT
DRSG OPSITE POSTOP 4X8 (GAUZE/BANDAGES/DRESSINGS) IMPLANT
DRSG PEEL AND PLACE PREVENA 20 (GAUZE/BANDAGES/DRESSINGS) ×4
ELECT BLADE 6.5 EXT (BLADE) IMPLANT
ELECT CAUTERY BLADE 6.4 (BLADE) ×2 IMPLANT
ELECT REM PT RETURN 9FT ADLT (ELECTROSURGICAL) ×2
ELECTRODE REM PT RTRN 9FT ADLT (ELECTROSURGICAL) ×1 IMPLANT
GLOVE SRG 8 PF TXTR STRL LF DI (GLOVE) ×1 IMPLANT
GLOVE SURG ENC MOIS LTX SZ7.5 (GLOVE) ×2 IMPLANT
GLOVE SURG UNDER POLY LF SZ8 (GLOVE) ×1
GOWN STRL REUS W/ TWL LRG LVL3 (GOWN DISPOSABLE) ×2 IMPLANT
GOWN STRL REUS W/ TWL XL LVL3 (GOWN DISPOSABLE) ×1 IMPLANT
GOWN STRL REUS W/TWL LRG LVL3 (GOWN DISPOSABLE) ×2
GOWN STRL REUS W/TWL XL LVL3 (GOWN DISPOSABLE) ×1
HANDLE SUCTION POOLE (INSTRUMENTS) ×1 IMPLANT
KIT BASIN OR (CUSTOM PROCEDURE TRAY) ×2 IMPLANT
KIT TURNOVER KIT B (KITS) ×2 IMPLANT
LIGASURE IMPACT 36 18CM CVD LR (INSTRUMENTS) IMPLANT
NS IRRIG 1000ML POUR BTL (IV SOLUTION) ×6 IMPLANT
PACK GENERAL/GYN (CUSTOM PROCEDURE TRAY) ×2 IMPLANT
PAD ARMBOARD 7.5X6 YLW CONV (MISCELLANEOUS) ×2 IMPLANT
PENCIL SMOKE EVACUATOR (MISCELLANEOUS) ×2 IMPLANT
SPECIMEN JAR LARGE (MISCELLANEOUS) IMPLANT
SPONGE T-LAP 18X18 ~~LOC~~+RFID (SPONGE) ×2 IMPLANT
STAPLER VISISTAT 35W (STAPLE) ×2 IMPLANT
SUCTION POOLE HANDLE (INSTRUMENTS) ×2
SUT PDS AB 0 CTX 36 PDP370T (SUTURE) ×4 IMPLANT
SUT PDS AB 1 TP1 54 (SUTURE) ×4 IMPLANT
SUT SILK 2 0 SH CR/8 (SUTURE) ×2 IMPLANT
SUT SILK 2 0 TIES 10X30 (SUTURE) IMPLANT
SUT SILK 3 0 SH CR/8 (SUTURE) IMPLANT
SUT SILK 3 0 TIES 10X30 (SUTURE) IMPLANT
TOWEL GREEN STERILE (TOWEL DISPOSABLE) ×2 IMPLANT
TRAY FOLEY MTR SLVR 16FR STAT (SET/KITS/TRAYS/PACK) IMPLANT
YANKAUER SUCT BULB TIP NO VENT (SUCTIONS) IMPLANT

## 2020-12-29 NOTE — Progress Notes (Signed)
12/29/20 0823  Airway 7.5 mm  Placement Date/Time: 12/28/20 0435   Placed By: ED Physician  Airway Device: Endotracheal Tube  Size (mm): 7.5 mm  Secured at (cm): 26 cm  Secured at (cm) 26 cm  Measured From Lips  Secured Location Right  Secured By Actuary Repositioned Yes  Prone position No  Cuff Pressure (cm H2O) Green OR 18-26 CmH2O  Site Condition Dry  Adult Ventilator Settings  Vent Type Servo i  Humidity HME  Vent Mode PRVC  Vt Set 550 mL  Set Rate 16 bmp  FiO2 (%) 40 %  I Time 0.9 Sec(s)  PEEP 5 cmH20  Adult Ventilator Measurements  Peak Airway Pressure 19 L/min  Mean Airway Pressure 8 cmH20  Plateau Pressure 14 cmH20  Resp Rate Spontaneous 0 br/min  Resp Rate Total 16 br/min  Exhaled Vt 558 mL  Measured Ve 6.2 mL  I:E Ratio Measured 1:2  Auto PEEP 0 cmH20  Total PEEP 55 cmH20  SpO2 98 %  Adult Ventilator Alarms  Alarms On Y  Ve High Alarm 20 L/min  Ve Low Alarm 4 L/min  Resp Rate High Alarm 38 br/min  Resp Rate Low Alarm 10  PEEP Low Alarm 3 cmH2O  Press High Alarm 4555 cmH2O  VAP Prevention  HME changed No  Ventilator changed No  Transported while on vent No  HOB> 30 Degrees Y  Equipment wiped down Yes  Daily Weaning Assessment  Daily Assessment of Readiness to Wean Wean protocol criteria not met  Reason not met Apnea  Breath Sounds  Bilateral Breath Sounds Diminished  Airway Suctioning/Secretions  Suction Type ETT  Suction Device  Catheter  Secretion Amount None  Suction Tolerance Tolerated well  Suctioning Adverse Effects None
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
## 2020-12-29 NOTE — Anesthesia Postprocedure Evaluation (Signed)
Anesthesia Post Note  Patient: Amaree Leeper  Procedure(s) Performed: RE-EXPLORATORY LAPAROTOMY AND ABDOMINAL CLOSURE (Abdomen)     Patient location during evaluation: ICU Anesthesia Type: General Level of consciousness: awake and alert Pain management: pain level controlled Vital Signs Assessment: post-procedure vital signs reviewed and stable Respiratory status: spontaneous breathing, nonlabored ventilation, respiratory function stable and patient connected to nasal cannula oxygen Cardiovascular status: blood pressure returned to baseline and stable Postop Assessment: no apparent nausea or vomiting Anesthetic complications: no   No notable events documented.  Last Vitals:  Vitals:   12/29/20 1300 12/29/20 1400  BP: 131/75 134/84  Pulse: 84 (!) 113  Resp: (!) 8 15  Temp:    SpO2: 97% 96%    Last Pain:  Vitals:   12/29/20 1400  TempSrc:   PainSc: 0-No pain                 Kennieth Rad

## 2020-12-29 NOTE — Anesthesia Preprocedure Evaluation (Signed)
Anesthesia Evaluation  Patient identified by MRN, date of birth, ID band Patient unresponsive    Reviewed: Allergy & Precautions, NPO status , Patient's Chart, lab work & pertinent test results, Unable to perform ROS - Chart review only  Airway Mallampati: Intubated       Dental   Pulmonary neg pulmonary ROS,    Pulmonary exam normal        Cardiovascular negative cardio ROS Normal cardiovascular exam     Neuro/Psych negative neurological ROS     GI/Hepatic negative GI ROS, Neg liver ROS,   Endo/Other  negative endocrine ROS  Renal/GU negative Renal ROS     Musculoskeletal negative musculoskeletal ROS (+)   Abdominal   Peds  Hematology  (+) anemia ,   Anesthesia Other Findings   Reproductive/Obstetrics                             Lab Results  Component Value Date   WBC 8.3 12/29/2020   HGB 10.5 (L) 12/29/2020   HCT 31.0 (L) 12/29/2020   MCV 83.5 12/29/2020   PLT 122 (L) 12/29/2020   Lab Results  Component Value Date   CREATININE 1.18 12/29/2020   BUN 12 12/29/2020   NA 143 12/29/2020   K 3.0 (L) 12/29/2020   CL 106 12/29/2020   CO2 25 12/29/2020    Anesthesia Physical  Anesthesia Plan  ASA: 4  Anesthesia Plan: General   Post-op Pain Management:    Induction: Intravenous  PONV Risk Score and Plan: Treatment may vary due to age or medical condition  Airway Management Planned: Oral ETT  Additional Equipment: Arterial line  Intra-op Plan:   Post-operative Plan: Post-operative intubation/ventilation  Informed Consent: I have reviewed the patients History and Physical, chart, labs and discussed the procedure including the risks, benefits and alternatives for the proposed anesthesia with the patient or authorized representative who has indicated his/her understanding and acceptance.       Plan Discussed with: Anesthesiologist and CRNA  Anesthesia Plan Comments:          Anesthesia Quick Evaluation

## 2020-12-29 NOTE — Transfer of Care (Signed)
Immediate Anesthesia Transfer of Care Note  Patient: Terez Freimark  Procedure(s) Performed: RE-EXPLORATORY LAPAROTOMY AND ABDOMINAL CLOSURE (Abdomen)  Patient Location: PACU and ICU  Anesthesia Type:General  Level of Consciousness: drowsy and confused  Airway & Oxygen Therapy: Patient Spontanous Breathing and Patient connected to face mask oxygen  Post-op Assessment: Report given to RN and Post -op Vital signs reviewed and stable  Post vital signs: Reviewed and stable  Last Vitals:  Vitals Value Taken Time  BP 129/74 12/29/20 1200  Temp    Pulse 151 12/29/20 1202  Resp 13 12/29/20 1202  SpO2 99 % 12/29/20 1202  Vitals shown include unvalidated device data.  Last Pain:  Vitals:   12/29/20 0800  TempSrc: Axillary         Complications: No notable events documented.

## 2020-12-30 ENCOUNTER — Encounter (HOSPITAL_COMMUNITY): Payer: Self-pay | Admitting: General Surgery

## 2020-12-30 LAB — BASIC METABOLIC PANEL
Anion gap: 9 (ref 5–15)
BUN: 7 mg/dL (ref 6–20)
CO2: 23 mmol/L (ref 22–32)
Calcium: 8.7 mg/dL — ABNORMAL LOW (ref 8.9–10.3)
Chloride: 107 mmol/L (ref 98–111)
Creatinine, Ser: 0.93 mg/dL (ref 0.61–1.24)
GFR, Estimated: >60 mL/min (ref >60)
Glucose, Bld: 85 mg/dL (ref 70–99)
Potassium: 3.6 mmol/L (ref 3.5–5.1)
Sodium: 139 mmol/L (ref 135–145)

## 2020-12-30 LAB — MAGNESIUM: Magnesium: 1.5 mg/dL — ABNORMAL LOW (ref 1.7–2.4)

## 2020-12-30 MED ORDER — ENOXAPARIN SODIUM 30 MG/0.3ML IJ SOSY
30.0000 mg | PREFILLED_SYRINGE | Freq: Two times a day (BID) | INTRAMUSCULAR | Status: DC
  Administered 2020-12-30 – 2021-01-03 (×9): 30 mg via SUBCUTANEOUS
  Filled 2020-12-30 (×9): qty 0.3

## 2020-12-30 NOTE — Progress Notes (Signed)
Patient discussed with me that he vapes very heavily. He added that he does not feel comfortable with his parents knowing this type of information. MD was notified and nicotine patch was ordered. Patient requests that his family is not in the room when nicotine patch is removed/applied.
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
## 2020-12-31 ENCOUNTER — Encounter (HOSPITAL_COMMUNITY): Payer: Self-pay | Admitting: General Surgery

## 2020-12-31 LAB — SURGICAL PATHOLOGY

## 2020-12-31 MED ORDER — KETOROLAC TROMETHAMINE 15 MG/ML IJ SOLN
30.0000 mg | Freq: Four times a day (QID) | INTRAMUSCULAR | Status: DC | PRN
Start: 2020-12-31 — End: 2020-12-31

## 2020-12-31 MED ORDER — KETOROLAC TROMETHAMINE 15 MG/ML IJ SOLN
30.0000 mg | Freq: Four times a day (QID) | INTRAMUSCULAR | Status: DC | PRN
  Administered 2021-01-02: 30 mg via INTRAVENOUS
  Filled 2020-12-31 (×2): qty 2

## 2020-12-31 MED ORDER — WHITE PETROLATUM EX OINT
TOPICAL_OINTMENT | CUTANEOUS | Status: AC
  Filled 2020-12-31: qty 28.35

## 2020-12-31 MED ORDER — OXYCODONE HCL 5 MG PO TABS
5.0000 mg | ORAL_TABLET | ORAL | Status: DC | PRN
Start: 2020-12-31 — End: 2021-01-04
  Administered 2020-12-31 – 2021-01-02 (×3): 5 mg via ORAL
  Filled 2020-12-31 (×3): qty 1

## 2020-12-31 MED ORDER — ACETAMINOPHEN 500 MG PO TABS
1000.0000 mg | ORAL_TABLET | Freq: Four times a day (QID) | ORAL | Status: DC | PRN
  Administered 2021-01-02: 1000 mg via ORAL
  Filled 2020-12-31: qty 2

## 2020-12-31 MED ORDER — ENSURE ENLIVE PO LIQD
237.0000 mL | Freq: Three times a day (TID) | ORAL | Status: DC
  Administered 2020-12-31 – 2021-01-03 (×6): 237 mL via ORAL
  Filled 2020-12-31: qty 237
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
## 2020-12-31 NOTE — Progress Notes (Signed)
Pt received on unit. Pt oriented to unit with bed locked in the lowest position with call bell within reach.
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
## 2020-12-31 NOTE — Discharge Instructions (Signed)
You may remove Prevena VAC once the alarm alerts that the battery has died. We have provided instructions on how to do this within discharge instructions.    [facility]      [facility], [place] [phone]  OPEN ABDOMINAL SURGERY: POST OP INSTRUCTIONS  Always review your discharge instruction sheet given to you by the facility where your surgery was performed.  IF YOU HAVE DISABILITY OR FAMILY LEAVE FORMS, YOU MUST BRING THEM TO THE OFFICE FOR PROCESSING.  PLEASE DO NOT GIVE THEM TO YOUR DOCTOR.  A prescription for pain medication may be given to you upon discharge.  Take your pain medication as prescribed, if needed.  If narcotic pain medicine is not needed, then you may take acetaminophen (Tylenol) or ibuprofen (Advil) as needed. Take your usually prescribed medications unless otherwise directed. If you need a refill on your pain medication, please contact your pharmacy. They will contact our office to request authorization.  Prescriptions will not be filled after 5pm or on week-ends. You should follow a light diet the first few days after arrival home, such as soup and crackers, pudding, etc.unless your doctor has advised otherwise. A high-fiber, low fat diet can be resumed as tolerated.   Be sure to include lots of fluids daily. Most patients will experience some swelling and bruising on the chest and neck area.  Ice packs will help.  Swelling and bruising can take several days to resolve Most patients will experience some swelling and bruising in the area of the incision. Ice pack will help. Swelling and bruising can take several days to resolve..  It is common to experience some constipation if taking pain medication after surgery.  Increasing fluid intake and taking a stool softener will usually help or prevent this problem from occurring.  A mild laxative (Milk of Magnesia or Miralax) should be taken according to package directions if there are no bowel movements after 48 hours.  You may  have steri-strips (small skin tapes) in place directly over the incision.  These strips should be left on the skin for 7-10 days.  If your surgeon used skin glue on the incision, you may shower in 24 hours.  The glue will flake off over the next 2-3 weeks.  Any sutures or staples will be removed at the office during your follow-up visit. You may find that a light gauze bandage over your incision may keep your staples from being rubbed or pulled. You may shower and replace the bandage daily. ACTIVITIES:  You may resume regular (light) daily activities beginning the next day--such as daily self-care, walking, climbing stairs--gradually increasing activities as tolerated.  You may have sexual intercourse when it is comfortable.  Refrain from any heavy lifting or straining until approved by your doctor. You may drive when you no longer are taking prescription pain medication, you can comfortably wear a seatbelt, and you can safely maneuver your car and apply brakes Return to Work: ___________________________________ Bonita Quin should see your doctor in the office for a follow-up appointment approximately two weeks after your surgery.  Make sure that you call for this appointment within a day or two after you arrive home to insure a convenient appointment time.  WHEN TO CALL YOUR DOCTOR: Fever over 101.0 Inability to urinate Nausea and/or vomiting Extreme swelling or bruising Continued bleeding from incision. Increased pain, redness, or drainage from the incision. Difficulty swallowing or breathing Muscle cramping or spasms. Numbness or tingling in hands or feet or around lips.  The clinic  staff is available to answer your questions during regular business hours.  Please don't hesitate to call and ask to speak to one of the nurses if you have concerns.  For further questions, please visit [email or web address]

## 2020-12-31 NOTE — Addendum Note (Signed)
Addendum  created 12/31/20 0804 by Adair Laundry, CRNA   Order list changed, Pharmacy for encounter modified

## 2021-01-01 LAB — TYPE AND SCREEN
ABO/RH(D): O POS
Antibody Screen: NEGATIVE
Unit division: 0
Unit division: 0
Unit division: 0
Unit division: 0
Unit division: 0
Unit division: 0
Unit division: 0
Unit division: 0
Unit division: 0
Unit division: 0
Unit division: 0
Unit division: 0

## 2021-01-01 LAB — BPAM RBC
Blood Product Expiration Date: 202207292359
Blood Product Expiration Date: 202207292359
Blood Product Expiration Date: 202207292359
Blood Product Expiration Date: 202207292359
Blood Product Expiration Date: 202208162359
Blood Product Expiration Date: 202208162359
Blood Product Expiration Date: 202208182359
Blood Product Expiration Date: 202208202359
Blood Product Expiration Date: 202208202359
Blood Product Expiration Date: 202208202359
Blood Product Expiration Date: 202208212359
Blood Product Expiration Date: 202208222359
ISSUE DATE / TIME: 202207220432
ISSUE DATE / TIME: 202207220445
ISSUE DATE / TIME: 202207220458
ISSUE DATE / TIME: 202207220458
ISSUE DATE / TIME: 202207220458
ISSUE DATE / TIME: 202207220458
ISSUE DATE / TIME: 202207220547
ISSUE DATE / TIME: 202207220547
ISSUE DATE / TIME: 202207220547
ISSUE DATE / TIME: 202207220547
ISSUE DATE / TIME: 202207220547
ISSUE DATE / TIME: 202207220547
Unit Type and Rh: 5100
Unit Type and Rh: 5100
Unit Type and Rh: 5100
Unit Type and Rh: 5100
Unit Type and Rh: 5100
Unit Type and Rh: 5100
Unit Type and Rh: 5100
Unit Type and Rh: 5100
Unit Type and Rh: 5100
Unit Type and Rh: 5100
Unit Type and Rh: 5100
Unit Type and Rh: 5100

## 2021-01-01 MED ORDER — DOCUSATE SODIUM 100 MG PO CAPS
100.0000 mg | ORAL_CAPSULE | Freq: Two times a day (BID) | ORAL | Status: DC
  Administered 2021-01-01 – 2021-01-03 (×4): 100 mg via ORAL
  Filled 2021-01-01 (×5): qty 1

## 2021-01-01 MED ORDER — HYDROXYZINE HCL 25 MG PO TABS
25.0000 mg | ORAL_TABLET | Freq: Three times a day (TID) | ORAL | Status: DC | PRN
  Administered 2021-01-02: 25 mg via ORAL
  Filled 2021-01-01: qty 1
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
## 2021-01-01 NOTE — Plan of Care (Signed)
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
## 2021-01-01 NOTE — Plan of Care (Signed)
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
## 2021-01-02 LAB — CBC
HCT: 34.9 % — ABNORMAL LOW (ref 39.0–52.0)
Hemoglobin: 11.9 g/dL — ABNORMAL LOW (ref 13.0–17.0)
MCH: 29.2 pg (ref 26.0–34.0)
MCHC: 34.1 g/dL (ref 30.0–36.0)
MCV: 85.5 fL (ref 80.0–100.0)
Platelets: 312 10*3/uL (ref 150–400)
RBC: 4.08 MIL/uL — ABNORMAL LOW (ref 4.22–5.81)
RDW: 13.3 % (ref 11.5–15.5)
WBC: 6.6 10*3/uL (ref 4.0–10.5)
nRBC: 0 % (ref 0.0–0.2)

## 2021-01-02 MED ORDER — ACETAMINOPHEN 500 MG PO TABS: 1000.0000 mg | ORAL_TABLET | Freq: Three times a day (TID) | ORAL | 0 refills | Status: AC | PRN

## 2021-01-02 MED ORDER — OXYCODONE HCL 5 MG PO TABS: 5.0000 mg | ORAL_TABLET | Freq: Four times a day (QID) | ORAL | 0 refills | Status: AC | PRN

## 2021-01-02 MED ORDER — HYDROXYZINE HCL 25 MG PO TABS: 25.0000 mg | ORAL_TABLET | Freq: Three times a day (TID) | ORAL | Status: AC | PRN
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
## 2021-01-02 NOTE — Plan of Care (Signed)

## 2021-01-03 ENCOUNTER — Inpatient Hospital Stay (HOSPITAL_COMMUNITY): Payer: Commercial Managed Care - PPO

## 2021-01-03 MED ORDER — TETANUS-DIPHTH-ACELL PERTUSSIS 5-2.5-18.5 LF-MCG/0.5 IM SUSY
0.5000 mL | PREFILLED_SYRINGE | Freq: Once | INTRAMUSCULAR | Status: AC
  Administered 2021-01-03: 0.5 mL via INTRAMUSCULAR
  Filled 2021-01-03: qty 0.5

## 2021-01-03 MED ORDER — SODIUM CHLORIDE 0.9 % IV BOLUS
1000.0000 mL | Freq: Once | INTRAVENOUS | Status: AC
  Administered 2021-01-03: 1000 mL via INTRAVENOUS
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
## 2021-01-03 NOTE — Progress Notes (Signed)
Pt waiting to be discharged. Notified Trauma CM about pt needing [employer] to travel home with due to discharge orders stating pt would discharge with this. Pt has hospital wound vac operating at this time but can not take this home. On call Trauma MD notified of situation. On call Trauma MD states she will come to the bedside. Awaiting her arrival.
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
